# Patient Record
Sex: Male | Born: 1980 | Race: Black or African American | Hispanic: No | Marital: Single | Smoking: Current every day smoker
Health system: Southern US, Community
[De-identification: ages and names within clinical notes are randomized; demographics above are authoritative.]

## PROBLEM LIST (undated history)

## (undated) HISTORY — PX: FOOT SURGERY: SHX648

---

## 2006-11-13 ENCOUNTER — Emergency Department: Payer: Self-pay | Admitting: Emergency Medicine

## 2007-06-18 ENCOUNTER — Emergency Department: Payer: Self-pay | Admitting: Emergency Medicine

## 2008-04-07 ENCOUNTER — Emergency Department: Payer: Self-pay | Admitting: Emergency Medicine

## 2008-12-31 ENCOUNTER — Emergency Department: Payer: Self-pay | Admitting: Unknown Physician Specialty

## 2014-12-15 ENCOUNTER — Emergency Department: Payer: Self-pay | Admitting: Emergency Medicine

## 2015-07-03 ENCOUNTER — Encounter: Payer: Self-pay | Admitting: Emergency Medicine

## 2015-07-03 ENCOUNTER — Emergency Department
Admission: EM | Admit: 2015-07-03 | Discharge: 2015-07-03 | Disposition: A | Discharge status: Home / Self Care | Payer: Self-pay | Attending: Emergency Medicine | Admitting: Emergency Medicine

## 2015-07-03 DIAGNOSIS — N342 Other urethritis: Secondary | ICD-10-CM | POA: Insufficient documentation

## 2015-07-03 DIAGNOSIS — N390 Urinary tract infection, site not specified: Secondary | ICD-10-CM

## 2015-07-03 DIAGNOSIS — Z720 Tobacco use: Secondary | ICD-10-CM | POA: Insufficient documentation

## 2015-07-03 LAB — URINALYSIS COMPLETE WITH MICROSCOPIC (ARMC ONLY)
Bilirubin Urine: NEGATIVE
GLUCOSE, UA: NEGATIVE mg/dL
HGB URINE DIPSTICK: NEGATIVE
KETONES UR: NEGATIVE mg/dL
Nitrite: NEGATIVE
PH: 5 (ref 5.0–8.0)
Protein, ur: NEGATIVE mg/dL
SPECIFIC GRAVITY, URINE: 1.027 (ref 1.005–1.030)
SQUAMOUS EPITHELIAL / LPF: NONE SEEN

## 2015-07-03 LAB — CHLAMYDIA/NGC RT PCR (ARMC ONLY)
CHLAMYDIA TR: NOT DETECTED
N gonorrhoeae: DETECTED — AB

## 2015-07-03 MED ORDER — CEPHALEXIN 500 MG PO CAPS: 500.0000 mg | ORAL_CAPSULE | Freq: Two times a day (BID) | ORAL | Status: AC

## 2015-07-03 MED ORDER — AZITHROMYCIN 250 MG PO TABS
1000.0000 mg | ORAL_TABLET | Freq: Once | ORAL | Status: AC
  Administered 2015-07-03: 1000 mg via ORAL
  Filled 2015-07-03: qty 4

## 2015-07-03 MED ORDER — CEFTRIAXONE SODIUM 250 MG IJ SOLR
250.0000 mg | Freq: Once | INTRAMUSCULAR | Status: AC
  Administered 2015-07-03: 250 mg via INTRAMUSCULAR
  Filled 2015-07-03: qty 250

## 2015-07-03 NOTE — Discharge Instructions (Signed)
You were treated for presumptive sexually transmitted disease with azithromycin pills and Rocephin injection. You will be called if the test result was positive so you can call and notify any of your sexual partners for evaluation and treatment. Return to emergency department for any worsening condition including abdominal pain, fever, skin rash, or worsening discharge.   Urethritis Urethritis is an inflammation of the tube through which urine exits your bladder (urethra).  CAUSES Urethritis is often caused by an infection in your urethra. The infection can be viral, like herpes. The infection can also be bacterial, like gonorrhea. RISK FACTORS Risk factors of urethritis include:  Having sex without using a condom.  Having multiple sexual partners.  Having poor hygiene. SIGNS AND SYMPTOMS Symptoms of urethritis are less noticeable in women than in men. These symptoms include:  Burning feeling when you urinate (dysuria).  Discharge from your urethra.  Blood in your urine (hematuria).  Urinating more than usual. DIAGNOSIS  To confirm a diagnosis of urethritis, your health care provider will do the following:  Ask about your sexual history.  Perform a physical exam.  Have you provide a sample of your urine for lab testing.  Use a cotton swab to gently collect a sample from your urethra for lab testing. TREATMENT  It is important to treat urethritis. Depending on the cause, untreated urethritis may lead to serious genital infections and possibly infertility. Urethritis caused by a bacterial infection is treated with antibiotic medicine. All sexual partners must be treated.  HOME CARE INSTRUCTIONS  Do not have sex until the test results are known and treatment is completed, even if your symptoms go away before you finish treatment.  If you were prescribed an antibiotic, finish it all even if you start to feel better. SEEK MEDICAL CARE IF:   Your symptoms are not improved in 3  days.  Your symptoms are getting worse.  You develop abdominal pain or pelvic pain (in women).  You develop joint pain.  You have a fever. SEEK IMMEDIATE MEDICAL CARE IF:   You have severe pain in the belly, back, or side.  You have repeated vomiting. MAKE SURE YOU:  Understand these instructions.  Will watch your condition.  Will get help right away if you are not doing well or get worse. Document Released: 05/15/2001 Document Revised: 04/05/2014 Document Reviewed: 07/20/2013 [facility] Patient Information 2015 [place], [place]. This information is not intended to replace advice given to you by your health care provider. Make sure you discuss any questions you have with your health care provider.    Urinary Tract Infection Urinary tract infections (UTIs) can develop anywhere along your urinary tract. Your urinary tract is your body's drainage system for removing wastes and extra water. Your urinary tract includes two kidneys, two ureters, a bladder, and a urethra. Your kidneys are a pair of bean-shaped organs. Each kidney is about the size of your fist. They are located below your ribs, one on each side of your spine. CAUSES Infections are caused by microbes, which are microscopic organisms, including fungi, viruses, and bacteria. These organisms are so small that they can only be seen through a microscope. Bacteria are the microbes that most commonly cause UTIs. SYMPTOMS  Symptoms of UTIs may vary by age and gender of the patient and by the location of the infection. Symptoms in young women typically include a frequent and intense urge to urinate and a painful, burning feeling in the bladder or urethra during urination. Older women and men  are more likely to be tired, shaky, and weak and have muscle aches and abdominal pain. A fever may mean the infection is in your kidneys. Other symptoms of a kidney infection include pain in your back or sides below the ribs, nausea, and  vomiting. DIAGNOSIS To diagnose a UTI, your caregiver will ask you about your symptoms. Your caregiver also will ask to provide a urine sample. The urine sample will be tested for bacteria and white blood cells. White blood cells are made by your body to help fight infection. TREATMENT  Typically, UTIs can be treated with medication. Because most UTIs are caused by a bacterial infection, they usually can be treated with the use of antibiotics. The choice of antibiotic and length of treatment depend on your symptoms and the type of bacteria causing your infection. HOME CARE INSTRUCTIONS  If you were prescribed antibiotics, take them exactly as your caregiver instructs you. Finish the medication even if you feel better after you have only taken some of the medication.  Drink enough water and fluids to keep your urine clear or pale yellow.  Avoid caffeine, tea, and carbonated beverages. They tend to irritate your bladder.  Empty your bladder often. Avoid holding urine for long periods of time.  Empty your bladder before and after sexual intercourse.  After a bowel movement, women should cleanse from front to back. Use each tissue only once. SEEK MEDICAL CARE IF:   You have back pain.  You develop a fever.  Your symptoms do not begin to resolve within 3 days. SEEK IMMEDIATE MEDICAL CARE IF:   You have severe back pain or lower abdominal pain.  You develop chills.  You have nausea or vomiting.  You have continued burning or discomfort with urination. MAKE SURE YOU:   Understand these instructions.  Will watch your condition.  Will get help right away if you are not doing well or get worse. Document Released: 08/29/2005 Document Revised: 05/20/2012 Document Reviewed: 12/28/2011 [facility] Patient Information 2015 [place], [place]. This information is not intended to replace advice given to you by your health care provider. Make sure you discuss any questions you have with your health  care provider.

## 2015-07-03 NOTE — ED Notes (Signed)
Pt discharged home after verbalizing understanding of discharge instructions; nad noted. 

## 2015-07-03 NOTE — ED Provider Notes (Signed)
[facility] Emergency Department Provider Note   ____________________________________________  Time seen: 7:15 AM I have reviewed the triage vital signs and the triage nursing note.  HISTORY  Chief Complaint Penile Discharge   Historian Patient  HPI Erik Mcdonald is a 34 y.o. male who has had 1 day penile discharge and burning with urination. He's had no known contacts for an STD, however has been treated for an STD many years ago. No abdominal pain or back pain. No fever. No skin rashes. No joint problems.    History reviewed. No pertinent past medical history. prior STD  There are no active problems to display for this patient.   Past Surgical History  Procedure Laterality Date  . Foot surgery      Current Outpatient Rx  Name  Route  Sig  Dispense  Refill  . cephALEXin (KEFLEX) 500 MG capsule   Oral   Take 1 capsule (500 mg total) by mouth 2 (two) times daily.   14 capsule   0     Allergies Review of patient's allergies indicates no known allergies.  History reviewed. No pertinent family history.  Social History History  Substance Use Topics  . Smoking status: Current Every Day Smoker  . Smokeless tobacco: Not on file  . Alcohol Use: No    Review of Systems  Constitutional: Negative for fever. Eyes: Negative for visual changes. ENT: Negative for sore throat. Cardiovascular: Negative for chest pain. Respiratory: Negative for shortness of breath. Gastrointestinal: Negative for abdominal pain, vomiting and diarrhea. Genitourinary: Per history of present illness. Musculoskeletal: Negative for back pain. Skin: Negative for rash. Neurological: Negative for headaches, focal weakness or numbness. 10 point Review of Systems otherwise negative ____________________________________________   PHYSICAL EXAM:  VITAL SIGNS: ED Triage Vitals  Enc Vitals Group     BP 07/03/15 0518 160/105 mmHg     Pulse Rate 07/03/15 0517 76   Resp 07/03/15 0517 18     Temp 07/03/15 0517 98.4 F (36.9 C)     Temp Source 07/03/15 0517 Oral     SpO2 07/03/15 0517 96 %     Weight 07/03/15 0517 192 lb (87.091 kg)     Height 07/03/15 0517 6' (1.829 m)     Head Cir --      Peak Flow --      Pain Score 07/03/15 0517 8     Pain Loc --      Pain Edu? --      Excl. in GC? --      Constitutional: Alert and oriented. Well appearing and in no distress. Eyes: Conjunctivae are normal. Normal extraocular movements. ENT   Head: Normocephalic and atraumatic.   Nose: No congestion/rhinnorhea.   Mouth/Throat: Mucous membranes are moist.   Neck: No stridor. Cardiovascular/Chest:  Respiratory: Normal respiratory effort without tachypnea nor retractions. Gastrointestinal: Soft. No distention, no guarding, no rebound. Nontende Genitourinary/rectal: No skin swelling, or skin rash. No visible discharge at the meatus. Musculoskeletal: Nontender with normal range of motion in all extremities.  Neurologic:  Normal speech and language. No gross or focal neurologic deficits are appreciated. Skin:  Skin is warm, dry and intact. No rash noted. Psychiatric: Mood and affect are normal. Speech and behavior are normal. Patient exhibits appropriate insight and judgment.  ____________________________________________   EKG I, Governor Rooks, MD, the attending physician have personally viewed and interpreted all ECGs.  Note ECG performed ____________________________________________  LABS (pertinent positives/negatives)  Urinalysis negative nitrites, 2+ leukocytes, 0-5 red  blood cells, too numerous to count white blood cells, rare bacteria, squamous epithelials none  GC and chlamydia are pending  ____________________________________________  RADIOLOGY All Xrays were viewed by me. Imaging interpreted by Radiologist.  None __________________________________________  PROCEDURES  Procedure(s) performed: None Critical Care performed:  None  ____________________________________________   ED COURSE / ASSESSMENT AND PLAN  CONSULTATIONS: None  Pertinent labs & imaging results that were available during my care of the patient were reviewed by me and considered in my medical decision making (see chart for details).   Patient was treated for clinical urethritis with Rocephin 250 mg IM and is at the present 1 g by mouth. Urinalysis had bacteria seen with a white blood cells and leukocytes, so he was also treated for urinary tract infection with Keflex. Urinalysis was sent for culture. His current chlamydia tests are pending and will be several hours, so he was treated presumptively.  Patient / Family / Caregiver informed of clinical course, medical decision-making process, and agree with plan.   I discussed return precautions, follow-up instructions, and discharged instructions with patient and/or family.  ___________________________________________   FINAL CLINICAL IMPRESSION(S) / ED DIAGNOSES   Final diagnoses:  Urethritis  Urinary tract infection without hematuria, site unspecified    FOLLOW UP  Referred to: [facility] clinic, primary care, one week.   Governor Rooks, MD 07/03/15 [phone]

## 2015-07-03 NOTE — ED Notes (Signed)
Resumed care from April; pt resting comfortably. NAD noted.

## 2015-07-03 NOTE — ED Notes (Signed)
Pt presents to ER alert and in NAD. Pt states penile d/c for 1 day. Triaged by Dara Lords RN.

## 2015-07-08 LAB — URINE CULTURE: Culture: 10000

## 2015-09-02 ENCOUNTER — Emergency Department
Admission: EM | Admit: 2015-09-02 | Discharge: 2015-09-02 | Disposition: A | Discharge status: Home / Self Care | Payer: Self-pay | Attending: Emergency Medicine | Admitting: Emergency Medicine

## 2015-09-02 ENCOUNTER — Encounter: Payer: Self-pay | Admitting: *Deleted

## 2015-09-02 DIAGNOSIS — Z792 Long term (current) use of antibiotics: Secondary | ICD-10-CM | POA: Insufficient documentation

## 2015-09-02 DIAGNOSIS — Z720 Tobacco use: Secondary | ICD-10-CM | POA: Insufficient documentation

## 2015-09-02 DIAGNOSIS — Y9289 Other specified places as the place of occurrence of the external cause: Secondary | ICD-10-CM | POA: Insufficient documentation

## 2015-09-02 DIAGNOSIS — Y9389 Activity, other specified: Secondary | ICD-10-CM | POA: Insufficient documentation

## 2015-09-02 DIAGNOSIS — Y998 Other external cause status: Secondary | ICD-10-CM | POA: Insufficient documentation

## 2015-09-02 DIAGNOSIS — S30860A Insect bite (nonvenomous) of lower back and pelvis, initial encounter: Secondary | ICD-10-CM | POA: Insufficient documentation

## 2015-09-02 DIAGNOSIS — W57XXXA Bitten or stung by nonvenomous insect and other nonvenomous arthropods, initial encounter: Secondary | ICD-10-CM | POA: Insufficient documentation

## 2015-09-02 MED ORDER — TRIAMCINOLONE ACETONIDE 0.025 % EX OINT: 1.0000 "application " | TOPICAL_OINTMENT | Freq: Two times a day (BID) | CUTANEOUS | Status: AC

## 2015-09-02 MED ORDER — CYPROHEPTADINE HCL 4 MG PO TABS
4.0000 mg | ORAL_TABLET | Freq: Once | ORAL | Status: AC
  Administered 2015-09-02: 4 mg via ORAL
  Filled 2015-09-02: qty 1

## 2015-09-02 MED ORDER — FAMOTIDINE 20 MG PO TABS
40.0000 mg | ORAL_TABLET | Freq: Once | ORAL | Status: AC
  Administered 2015-09-02: 40 mg via ORAL
  Filled 2015-09-02: qty 2

## 2015-09-02 MED ORDER — PERMETHRIN 5 % EX CREA: TOPICAL_CREAM | CUTANEOUS | Status: AC

## 2015-09-02 MED ORDER — CYPROHEPTADINE HCL 4 MG PO TABS: 4.0000 mg | ORAL_TABLET | Freq: Three times a day (TID) | ORAL | Status: AC | PRN

## 2015-09-02 MED ORDER — RANITIDINE HCL 150 MG PO TABS: 150.0000 mg | ORAL_TABLET | Freq: Two times a day (BID) | ORAL | Status: AC

## 2015-09-02 NOTE — Discharge Instructions (Signed)
Bedbugs °Bedbugs are tiny bugs that live in and around beds. During the day, they hide in mattresses and other places near beds. They come out at night and bite people lying in bed. They need blood to live and grow. Bedbugs can be found in beds anywhere. Usually, they are found in places where many people come and go (hotels, shelters, hospitals). It does not matter whether the place is dirty or clean. °Getting bitten by bedbugs rarely causes a medical problem. The biggest problem can be getting rid of them.  This often takes the work of a pest control expert. °CAUSES °· Less use of pesticides. Bedbugs were common before the 1950s. Then, strong pesticides such as DDT nearly wiped them out. Today, these pesticides are not used because they harm the environment and can cause health problems. °· More travel. Besides mattresses, bedbugs can also live in clothing and luggage. They can come along as people travel from place to place. Bedbugs are more common in certain parts of the world. When people travel to those areas, the bugs can come home with them. °· Presence of birds and bats. Bedbugs often infest birds and bats. If you have these animals in or near your home, bedbugs may infest your house, too. °SYMPTOMS °It does not hurt to be bitten by a bedbug. You will probably not wake up when you are bitten. Bedbugs usually bite areas of the skin that are not covered. Symptoms may show when you wake up, or they may take a day or more to show up. Symptoms may include: °· Small red bumps on the skin. These might be lined up in a row or clustered in a group. °· A darker red dot in the middle of red bumps. °· Blisters on the skin. There may be swelling and very bad itching. These may be signs of an allergic reaction. This does not happen often. °DIAGNOSIS °Bedbug bites might look and feel like other types of insect bites. The bugs do not stay on the body like ticks or lice. They bite, drop off, and crawl away to hide. Your  caregiver will probably: °· Ask about your symptoms. °· Ask about your recent activities and travel. °· Check your skin for bedbug bites. °· Ask you to check at home for signs of bedbugs. You should look for: °¨ Spots or stains on the bed or nearby. This could be from bedbugs that were crushed or from their eggs or waste. °¨ Bedbugs themselves. They are reddish-brown, oval, and flat. They do not fly. They are about the size of an apple seed. °· Places to look for bedbugs include: °¨ Beds. Check mattresses, headboards, box springs, and bed frames. °¨ On drapes and curtains near the bed. °¨ Under carpeting in the bedroom. °¨ Behind electrical outlets. °¨ Behind any wallpaper that is peeling. °¨ Inside luggage. °TREATMENT °Most bedbug bites do not need treatment. They usually go away on their own in a few days. The bites are not dangerous. However, treatment may be needed if you have scratched so much that your skin has become infected. You may also need treatment if you are allergic to bedbug bites. Treatment options include: °· A drug that stops swelling and itching (corticosteroid). Usually, a cream is rubbed on the skin. If you have a bad rash, you may be given a corticosteroid pill. °· Oral antihistamines. These are pills to help control itching. °· Antibiotic medicines. An antibiotic may be prescribed for infected skin. °HOME CARE INSTRUCTIONS  °·   Take any medicine prescribed by your caregiver for your bites. Follow the directions carefully.  Consider wearing pajamas with long sleeves and pant legs.  Your bedroom may need to be treated. A pest control expert should make sure the bedbugs are gone. You may need to throw away mattresses or luggage. Ask the pest control expert what you can do to keep the bedbugs from coming back. Common suggestions include:  Putting a plastic cover over your mattress.  Washing and drying your clothes and bedding in hot water and a hot dryer. The temperature should be hotter  than 120 F (48.9 C). Bedbugs are killed by high temperatures.  Vacuuming carefully all around your bed. Vacuum in all cracks and crevices where the bugs might hide. Do this often.  Carefully checking all used furniture, bedding, or clothes that you bring into your house.  Eliminating bird nests and bat roosts.  If you get bedbug bites when traveling, check all your possessions carefully before bringing them into your house. If you find any bugs on clothes or in your luggage, consider throwing those items away. SEEK MEDICAL CARE IF:  You have red bug bites that keep coming back.  You have red bug bites that itch badly.  You have bug bites that cause a skin rash.  You have scratch marks that are red and sore. SEEK IMMEDIATE MEDICAL CARE IF: You have a fever. Document Released: 12/22/2010 Document Revised: 02/11/2012 Document Reviewed: 12/22/2010 [facility] Patient Information 2015 [place], [place]. This information is not intended to replace advice given to you by your health care provider. Make sure you discuss any questions you have with your health care provider.  Your exam is suspicious for infestation with bedbugs. Use the prescription lotion as directed. Take the meds as needed for itch.

## 2015-09-02 NOTE — ED Notes (Signed)
Pt reports bumps noted to inside of hands x 2 days. Reports it is all over body.

## 2015-09-02 NOTE — ED Notes (Signed)
Rash all over for 2 days

## 2015-09-02 NOTE — ED Notes (Signed)
meds ordered from pharmacy.

## 2015-09-02 NOTE — ED Notes (Signed)
Called pharmacy again requesting the medication.  They stated that they will send it over.

## 2015-09-02 NOTE — ED Provider Notes (Signed)
[facility] Emergency Department Provider Note ____________________________________________  Time seen: 1635  I have reviewed the triage vital signs and the nursing notes.  HISTORY  Chief Complaint  Rash  HPI Erik Mcdonald is a 34 y.o. male reports to the ED for evaluation of a rash to the hands, torso, and legs for the last 2 days. He does admit to sleeping at a friend's house, and the guest bedroom 3 days prior to arrival. Within 24 hours, he noted the rash. He does also admit to sleeping naked in the guest bedroom. He is also been reported that another guest had a similar rash outbreak while standing in the guest bedroom. He notes some itchiness to the rash to the palms, and irritation to the rash that is present in his gluteal cleft, which is aggravated by sweating. He denies any sick contacts, recent travel, or other exposures. He does admit to hypersensitivity reaction related to other insect bites.  History reviewed. No pertinent past medical history.  There are no active problems to display for this patient.   Past Surgical History  Procedure Laterality Date  . Foot surgery      Current Outpatient Rx  Name  Route  Sig  Dispense  Refill  . cephALEXin (KEFLEX) 500 MG capsule   Oral   Take 1 capsule (500 mg total) by mouth 2 (two) times daily.   14 capsule   0   . cyproheptadine (PERIACTIN) 4 MG tablet   Oral   Take 1 tablet (4 mg total) by mouth 3 (three) times daily as needed for allergies.   30 tablet   0   . permethrin (ELIMITE) 5 % cream      Apply from neck to feet x 1 Rinse after 24 hours.   60 g   0   . ranitidine (ZANTAC) 150 MG tablet   Oral   Take 1 tablet (150 mg total) by mouth 2 (two) times daily.   20 tablet   0   . triamcinolone (KENALOG) 0.025 % ointment   Topical   Apply 1 application topically 2 (two) times daily.   30 g   0    Allergies Review of patient's allergies indicates no known allergies.  No  family history on file.  Social History Social History  Substance Use Topics  . Smoking status: Current Every Day Smoker  . Smokeless tobacco: None  . Alcohol Use: Yes   Review of Systems  Constitutional: Negative for fever. Eyes: Negative for visual changes. ENT: Negative for sore throat. Cardiovascular: Negative for chest pain. Respiratory: Negative for shortness of breath. Gastrointestinal: Negative for abdominal pain, vomiting and diarrhea. Genitourinary: Negative for dysuria. Musculoskeletal: Negative for back pain. Skin: Positive for rash. Neurological: Negative for headaches, focal weakness or numbness. ____________________________________________  PHYSICAL EXAM:  VITAL SIGNS: ED Triage Vitals  Enc Vitals Group     BP 09/02/15 1614 147/89 mmHg     Pulse Rate 09/02/15 1614 75     Resp 09/02/15 1614 16     Temp 09/02/15 1614 98 F (36.7 C)     Temp Source 09/02/15 1614 Oral     SpO2 09/02/15 1614 95 %     Weight 09/02/15 1614 202 lb (91.627 kg)     Height 09/02/15 1614  (1.803 m)     Head Cir --      Peak Flow --      Pain Score --      Pain Loc --  Pain Edu? --      Excl. in GC? --    Constitutional: Alert and oriented. Well appearing and in no distress. Eyes: Conjunctivae are normal. PERRL. Normal extraocular movements. ENT   Head: Normocephalic and atraumatic.   Nose: No congestion/rhinorrhea.   Mouth/Throat: Mucous membranes are moist.   Neck: Supple. No thyromegaly. Hematological/Lymphatic/Immunological: No cervical lymphadenopathy. Cardiovascular: Normal rate, regular rhythm.  Respiratory: Normal respiratory effort. No wheezes/rales/rhonchi. Gastrointestinal: Soft and nontender. No distention. Musculoskeletal: Nontender with normal range of motion in all extremities.  Neurologic:  Normal gait without ataxia. Normal speech and language. No gross focal neurologic deficits are appreciated. Skin:  Skin is warm, dry and intact.  Patient with a papular rash noted to the hands, extremities, and torso. The rash is concentrated around the waist, and to the webspaces of the fingers. The noted to be pustular in nature. Psychiatric: Mood and affect are normal. Patient exhibits appropriate insight and judgment. ____________________________________________  PROCEDURES  Periactin 4 mg PO Famotodine 40 mg PO ____________________________________________  INITIAL IMPRESSION / ASSESSMENT AND PLAN / ED COURSE  Patient with the clinical diagnosis consistent with the bedbug exposure. He'll be discharged with prescriptions for Periactin, Zantac, Chantix alone cream, and Elimite cream to use. He is encouraged follow-up with his friend related to the potential bedbug exposure. Follow up with primary care provider as needed. ____________________________________________  FINAL CLINICAL IMPRESSION(S) / ED DIAGNOSES  Final diagnoses:  Bedbug bite      Lissa Hoard, PA-C 09/02/15 1851  Minna Antis, MD 09/02/15 2222

## 2016-05-14 ENCOUNTER — Encounter: Payer: Self-pay | Admitting: Emergency Medicine

## 2016-05-14 ENCOUNTER — Emergency Department
Admission: EM | Admit: 2016-05-14 | Discharge: 2016-05-14 | Disposition: A | Discharge status: Home / Self Care | Payer: Self-pay | Attending: Emergency Medicine | Admitting: Emergency Medicine

## 2016-05-14 DIAGNOSIS — R369 Urethral discharge, unspecified: Secondary | ICD-10-CM | POA: Insufficient documentation

## 2016-05-14 DIAGNOSIS — Z202 Contact with and (suspected) exposure to infections with a predominantly sexual mode of transmission: Secondary | ICD-10-CM | POA: Insufficient documentation

## 2016-05-14 DIAGNOSIS — Z792 Long term (current) use of antibiotics: Secondary | ICD-10-CM | POA: Insufficient documentation

## 2016-05-14 DIAGNOSIS — F17200 Nicotine dependence, unspecified, uncomplicated: Secondary | ICD-10-CM | POA: Insufficient documentation

## 2016-05-14 DIAGNOSIS — Z7982 Long term (current) use of aspirin: Secondary | ICD-10-CM | POA: Insufficient documentation

## 2016-05-14 LAB — URINALYSIS COMPLETE WITH MICROSCOPIC (ARMC ONLY)
BILIRUBIN URINE: NEGATIVE
GLUCOSE, UA: NEGATIVE mg/dL
Hgb urine dipstick: NEGATIVE
KETONES UR: NEGATIVE mg/dL
NITRITE: NEGATIVE
Protein, ur: 30 mg/dL — AB
SPECIFIC GRAVITY, URINE: 1.026 (ref 1.005–1.030)
pH: 5 (ref 5.0–8.0)

## 2016-05-14 LAB — CHLAMYDIA/NGC RT PCR (ARMC ONLY)
Chlamydia Tr: NOT DETECTED
N GONORRHOEAE: NOT DETECTED

## 2016-05-14 NOTE — ED Notes (Signed)
States he is having some dysuria and penile discharge

## 2016-05-14 NOTE — ED Provider Notes (Signed)
[facility] Emergency Department Provider Note   ____________________________________________  Time seen: Approximately 3:54 PM  I have reviewed the triage vital signs and the nursing notes.   HISTORY  Chief Complaint Exposure to STD    HPI Erik Mcdonald is a 35 y.o. male patient complain to 3 days urethral discharge.Patient last unprotected sexual intercourse was one week ago. He denies any penial lesions. Patient also complaining of dysuria. Patient denies any fever or chills. Patient denies any pain.   History reviewed. No pertinent past medical history.  There are no active problems to display for this patient.   Past Surgical History  Procedure Laterality Date  . Foot surgery      Current Outpatient Rx  Name  Route  Sig  Dispense  Refill  . cephALEXin (KEFLEX) 500 MG capsule   Oral   Take 1 capsule (500 mg total) by mouth 2 (two) times daily.   14 capsule   0   . cyproheptadine (PERIACTIN) 4 MG tablet   Oral   Take 1 tablet (4 mg total) by mouth 3 (three) times daily as needed for allergies.   30 tablet   0   . permethrin (ELIMITE) 5 % cream      Apply from neck to feet x 1 Rinse after 24 hours.   60 g   0   . ranitidine (ZANTAC) 150 MG tablet   Oral   Take 1 tablet (150 mg total) by mouth 2 (two) times daily.   20 tablet   0   . triamcinolone (KENALOG) 0.025 % ointment   Topical   Apply 1 application topically 2 (two) times daily.   30 g   0     Allergies Review of patient's allergies indicates no known allergies.  No family history on file.  Social History Social History  Substance Use Topics  . Smoking status: Current Every Day Smoker  . Smokeless tobacco: None  . Alcohol Use: Yes    Review of Systems Constitutional: No fever/chills Eyes: No visual changes. ENT: No sore throat. Cardiovascular: Denies chest pain. Respiratory: Denies shortness of breath. Gastrointestinal: No abdominal pain.  No  nausea, no vomiting.  No diarrhea.  No constipation. Genitourinary: Positive for dysuria and urethral discharge. Musculoskeletal: Negative for back pain. Skin: Negative for rash. Neurological: Negative for headaches, focal weakness or numbness.    ____________________________________________   PHYSICAL EXAM:  VITAL SIGNS: ED Triage Vitals  Enc Vitals Group     BP 05/14/16 1547 164/101 mmHg     Pulse Rate 05/14/16 1547 91     Resp 05/14/16 1547 18     Temp 05/14/16 1547 98.5 F (36.9 C)     Temp Source 05/14/16 1547 Oral     SpO2 05/14/16 1547 99 %     Weight 05/14/16 1547 205 lb (92.987 kg)     Height 05/14/16 1547 6' (1.829 m)     Head Cir --      Peak Flow --      Pain Score 05/14/16 1547 0     Pain Loc --      Pain Edu? --      Excl. in GC? --     Constitutional: Alert and oriented. Well appearing and in no acute distress. Eyes: Conjunctivae are normal. PERRL. EOMI. Head: Atraumatic. Nose: No congestion/rhinnorhea. Mouth/Throat: Mucous membranes are moist.  Oropharynx non-erythematous. Neck: No stridor.  No cervical spine tenderness to palpation. Hematological/Lymphatic/Immunilogical: No cervical lymphadenopathy. Cardiovascular: Normal  rate, regular rhythm. Grossly normal heart sounds.  Good peripheral circulation. Respiratory: Normal respiratory effort.  No retractions. Lungs CTAB. Gastrointestinal: Soft and nontender. No distention. No abdominal bruits. No CVA tenderness. Genitourinary: No penial lesions or discharge expressed from the urethra. No inguinal adenopathy. Musculoskeletal: No lower extremity tenderness nor edema.  No joint effusions. Neurologic:  Normal speech and language. No gross focal neurologic deficits are appreciated. No gait instability. Skin:  Skin is warm, dry and intact. No rash noted. Psychiatric: Mood and affect are normal. Speech and behavior are normal.  ____________________________________________   LABS (all labs ordered are  listed, but only abnormal results are displayed)  Labs Reviewed  URINALYSIS COMPLETEWITH MICROSCOPIC ([facility] ONLY) - Abnormal; Notable for the following:    Color, Urine YELLOW (*)    APPearance CLEAR (*)    Protein, ur 30 (*)    Leukocytes, UA TRACE (*)    Bacteria, UA RARE (*)    Squamous Epithelial / LPF 0-5 (*)    All other components within normal limits  CHLAMYDIA/NGC RT PCR ([facility] ONLY)   ____________________________________________  EKG   ____________________________________________  RADIOLOGY   ____________________________________________   PROCEDURES  Procedure(s) performed: None  Critical Care performed: No  ____________________________________________   INITIAL IMPRESSION / ASSESSMENT AND PLAN / ED COURSE  Pertinent labs & imaging results that were available during my care of the patient were reviewed by me and considered in my medical decision making (see chart for details).  Discussed the patient were negative urinalysis. Patient states after reviewing lab results that he is only here because his girlfriend told him he was exposed. Patient now denies urethral discharge. Just concern for STD. Advised patient [facility] for further evaluation and testing as needed. ____________________________________________   FINAL CLINICAL IMPRESSION(S) / ED DIAGNOSES  Final diagnoses:  Possible exposure to STD      NEW MEDICATIONS STARTED DURING THIS VISIT:  New Prescriptions   No medications on file     Note:  This document was prepared using Dragon voice recognition software and may include unintentional dictation errors.    Joni ReiningRonald K Smith, PA-C 05/14/16 1722  Sharman CheekPhillip Stafford, MD 05/15/16 [phone]

## 2016-06-19 IMAGING — CT CT HEAD WITHOUT CONTRAST
1 of 2 series · 15 of 30 positions shown, 19 images · non-contrast
Comparison: None.

CLINICAL DATA: Facial pain and swelling after multiple falls last
night. Initial encounter.

EXAM:
CT HEAD WITHOUT CONTRAST
TECHNIQUE: Contiguous axial images were obtained from the base of the skull
through the vertex without intravenous contrast.

[Series 2: head wo · axial · 0.42mm/px · z∈[+230,+364]mm · 15 of 36 slices shown, 19 images]
[im 3/36  brain]
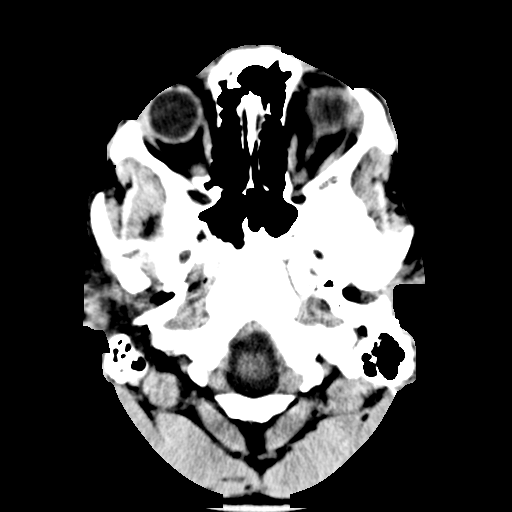
[im 3/36  bone]
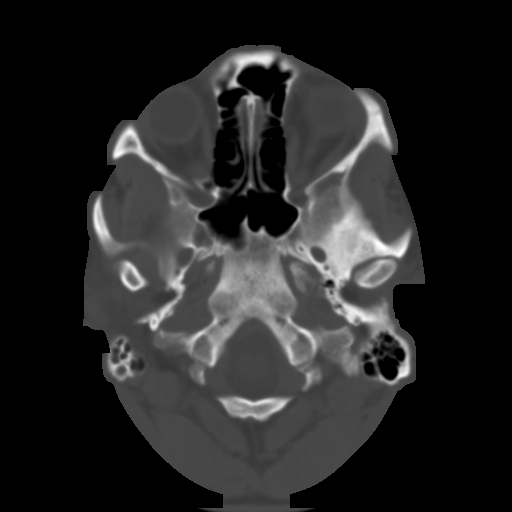
[im 5/36  brain]
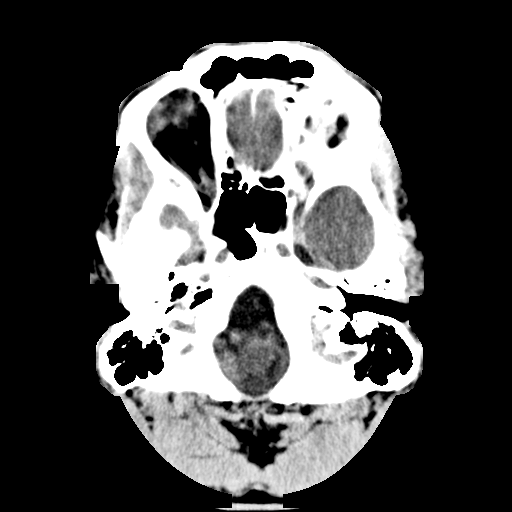
[im 7/36  brain]
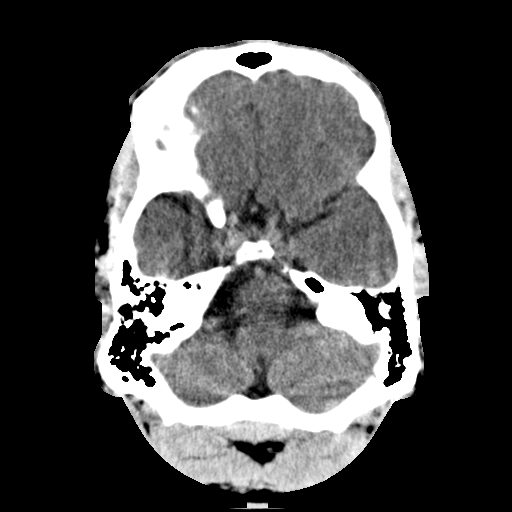
[im 9/36  brain]
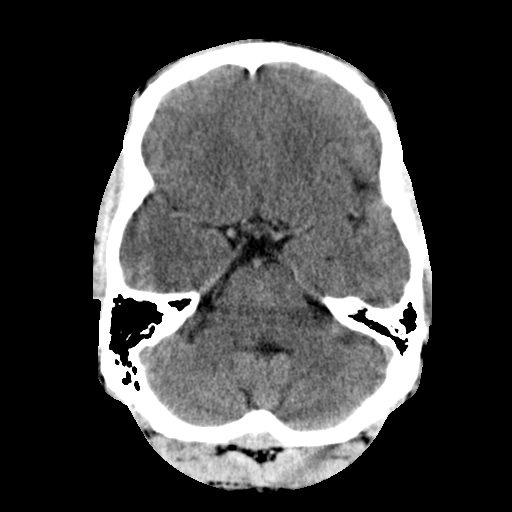
[im 11/36  brain]
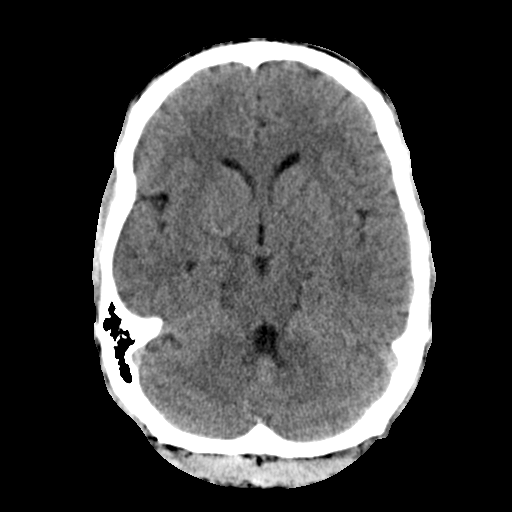
[im 11/36  bone]
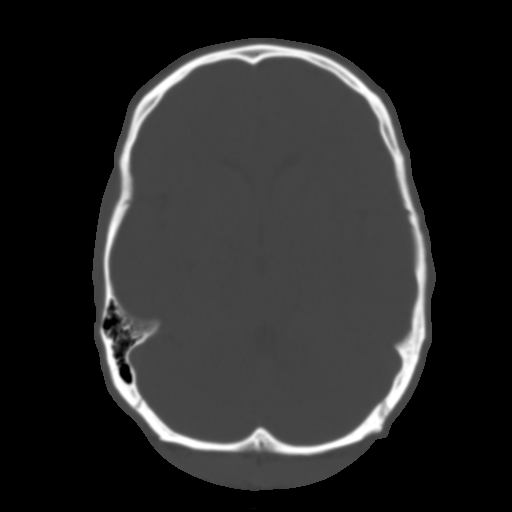
[im 14/36  brain]
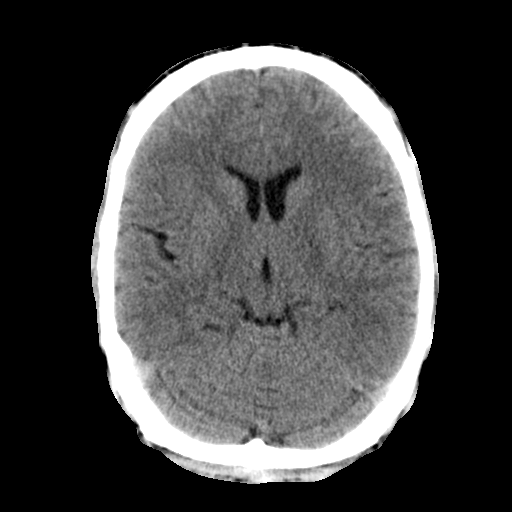
[im 16/36  brain]
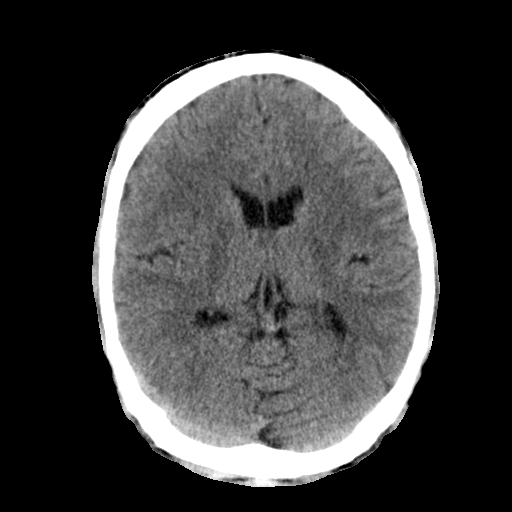
[im 18/36  brain]
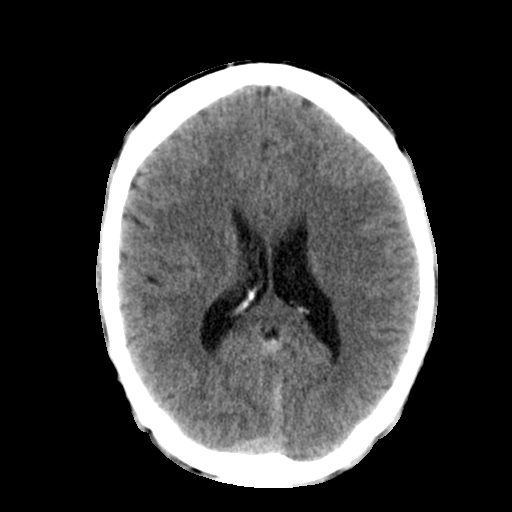
[im 20/36  brain]
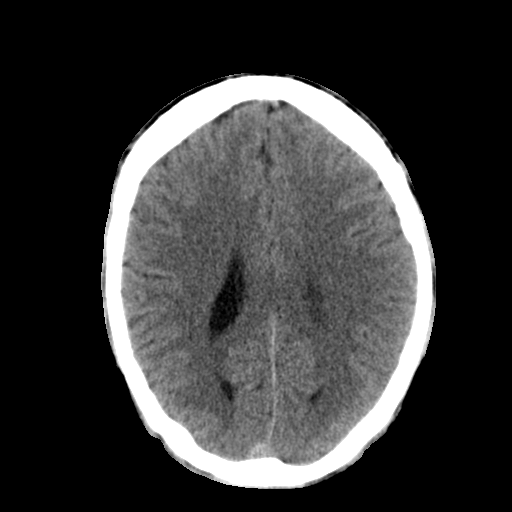
[im 20/36  bone]
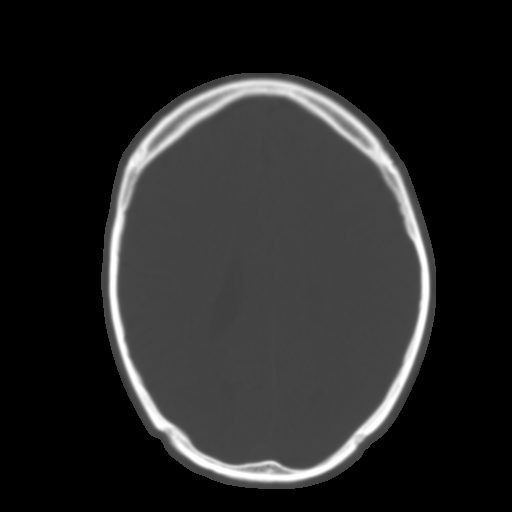
[im 22/36  brain]
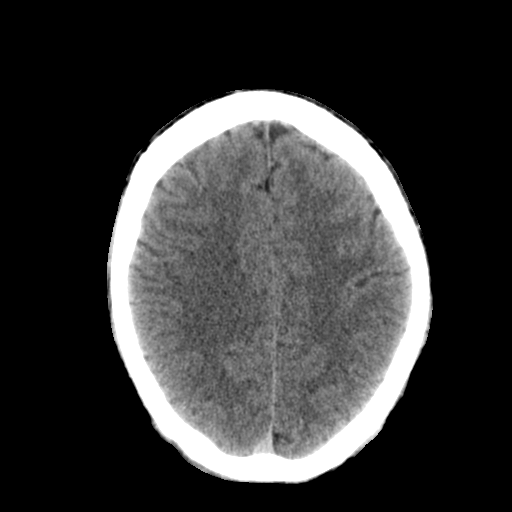
[im 25/36  brain]
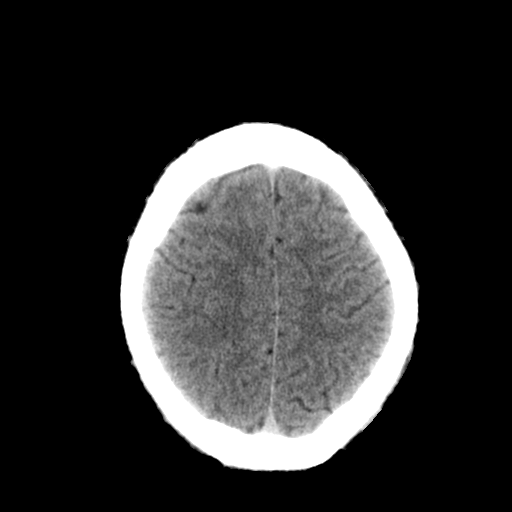
[im 27/36  brain]
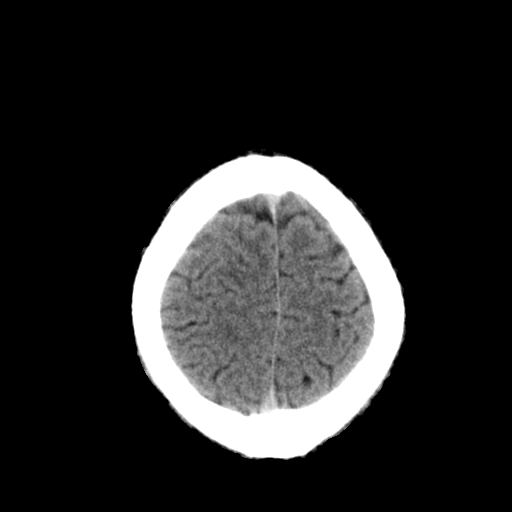
[im 29/36  brain]
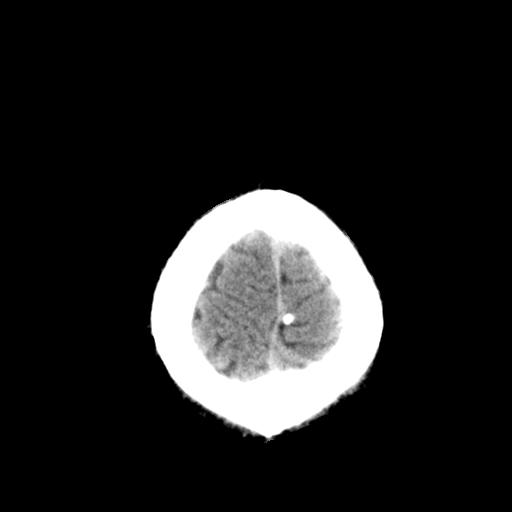
[im 29/36  bone]
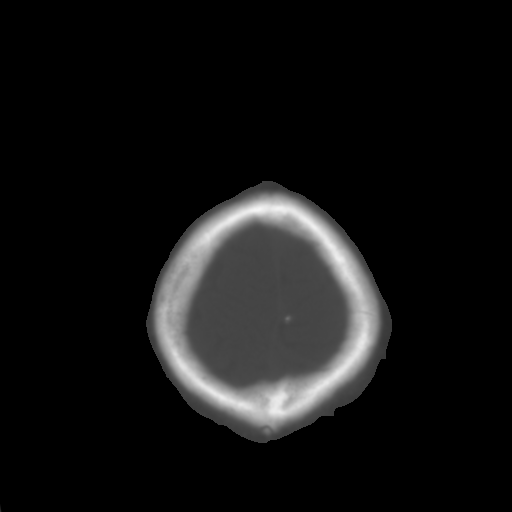
[im 31/36  brain]
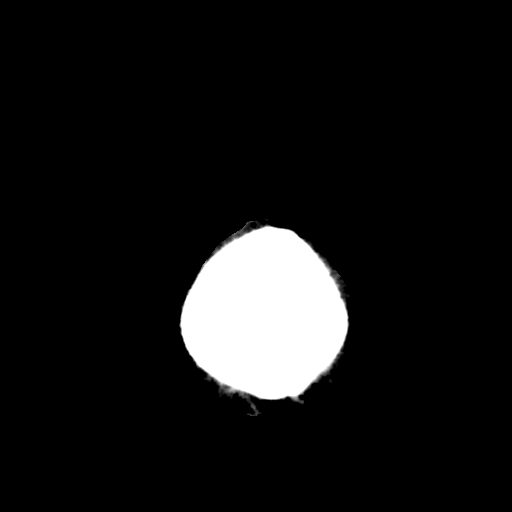
[im 33/36  brain]
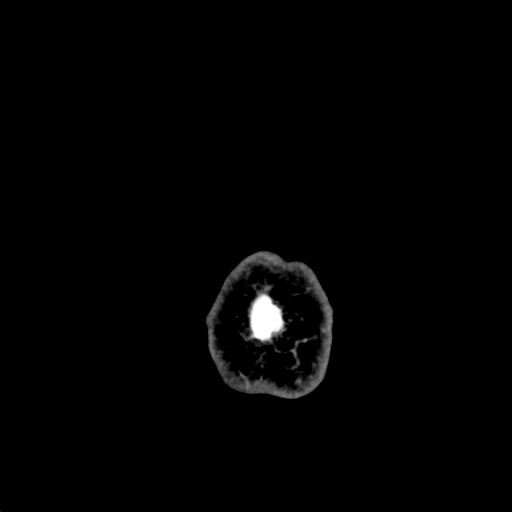

[15 of 30 positions shown; findings below may reference images not displayed]

FINDINGS: There is no evidence of acute intracranial hemorrhage, mass lesion,
brain edema or extra-axial fluid collection. The ventricles and
subarachnoid spaces are appropriately sized for age. There is no CT
evidence of acute cortical infarction.

The visualized paranasal sinuses, mastoid air cells and middle ears
are clear. The calvarium is intact. There is some soft tissue
swelling over the bridge of the nose, extending into the frontal
scalp.
IMPRESSION: No acute intracranial or calvarial findings. Facial soft tissue
swelling.

## 2023-11-03 ENCOUNTER — Emergency Department
Admission: EM | Admit: 2023-11-03 | Discharge: 2023-11-03 | Attending: Emergency Medicine | Admitting: Emergency Medicine

## 2023-11-03 ENCOUNTER — Other Ambulatory Visit: Payer: Self-pay

## 2023-11-03 DIAGNOSIS — R21 Rash and other nonspecific skin eruption: Secondary | ICD-10-CM | POA: Insufficient documentation

## 2023-11-03 DIAGNOSIS — T7840XA Allergy, unspecified, initial encounter: Secondary | ICD-10-CM | POA: Diagnosis present

## 2023-11-03 DIAGNOSIS — R238 Other skin changes: Secondary | ICD-10-CM

## 2023-11-03 MED ORDER — PREDNISONE 20 MG PO TABS
60.0000 mg | ORAL_TABLET | Freq: Once | ORAL | Status: AC
Start: 2023-11-03 — End: 2023-11-03
  Administered 2023-11-03: 60 mg via ORAL
  Filled 2023-11-03: qty 3

## 2023-11-03 MED ORDER — PREDNISONE 10 MG PO TABS: 60.0000 mg | ORAL_TABLET | Freq: Every day | ORAL | 0 refills | Status: AC

## 2023-11-03 NOTE — ED Provider Notes (Addendum)
[facility] Provider Note    Event Date/Time   First MD Initiated Contact with Patient 11/03/23 0827     (approximate)   History   Allergic Reaction   HPI Erik Mcdonald is a 42 y.o. male presenting today for rash.  Patient states on Thursday he started having bumps appear over his body.  He thought he may have had an allergic reaction to food served on Thanksgiving at the prison.  Notes bumps everywhere all over his body which are itchy and slightly painful.  Some of them look like pustules.  Denies fever, chills, shortness of breath, nausea, vomiting, abdominal pain, cough, congestion.  Does report history of chickenpox as a child.  Patient states that he did get all of his vaccinations as a child.     Physical Exam   Triage Vital Signs: ED Triage Vitals [11/03/23 0824]  Encounter Vitals Group     BP (!) 169/101     Systolic BP Percentile      Diastolic BP Percentile      Pulse Rate 92     Resp 19     Temp 98 F (36.7 C)     Temp src      SpO2 100 %     Weight      Height      Head Circumference      Peak Flow      Pain Score 10     Pain Loc      Pain Education      Exclude from Growth Chart     Most recent vital signs: Vitals:   11/03/23 0824  BP: (!) 169/101  Pulse: 92  Resp: 19  Temp: 98 F (36.7 C)  SpO2: 100%   I have reviewed the vital signs. General:  Awake, alert, no acute distress. Head:  Normocephalic, Atraumatic. EENT:  PERRL, EOMI, Oral mucosa pink and moist, Neck is supple. Cardiovascular: Regular rate, 2+ distal pulses. Respiratory:  Normal respiratory effort, symmetrical expansion, no distress.   Extremities:  Moving all four extremities through full ROM without pain.   Neuro:  Alert and oriented.  Interacting appropriately.   Skin: Vesicular rash scattered throughout the body in various stages of healing. Psych: Appropriate affect.    ED Results / Procedures / Treatments   Labs (all labs ordered are  listed, but only abnormal results are displayed) Labs Reviewed - No data to display   EKG    RADIOLOGY    PROCEDURES:  Critical Care performed: No  Procedures   MEDICATIONS ORDERED IN ED: Medications  predniSONE (DELTASONE) tablet 60 mg (has no administration in time range)     IMPRESSION / MDM / ASSESSMENT AND PLAN / ED COURSE  I reviewed the triage vital signs and the nursing notes.                              Differential diagnosis includes, but is not limited to, chickenpox, viral exanthem, low suspicion for allergic reaction  Patient's presentation is most consistent with acute, uncomplicated illness.  Patient is a 42 year old male presenting today for vesicular rash throughout his body.  Visual exam shows a rash of vesicles throughout the body in various stages of healing with some pus drainage from some of them.  The rash looks very consistent with a typical chickenpox rash.  He does have a history of chickenpox in the past when he was  young so although would be rare, it is not unlikely that he has gotten this again.  Otherwise, potential other viruses could cause this.  Will start patient on a short course of prednisone for symptomatic management and have him follow-up with the doctor in the prison in a couple of days for reassessment.  He has no other systemic symptoms with stable vital signs and otherwise unremarkable physical exam and I do not think further workup is indicated at this time.     FINAL CLINICAL IMPRESSION(S) / ED DIAGNOSES   Final diagnoses:  Vesicular rash     Rx / DC Orders   ED Discharge Orders          Ordered    predniSONE (DELTASONE) 10 MG tablet  Daily        11/03/23 0843             Note:  This document was prepared using Dragon voice recognition software and may include unintentional dictation errors.   Janith Lima, MD 11/03/23 4098    Janith Lima, MD 11/03/23 [phone]

## 2023-11-03 NOTE — ED Triage Notes (Addendum)
Pt comes with possible allergic reaction. Pt states he had Thanksgiving food at [employer] and then it started after that. Pt states only new stuff was dressing and gravy. Pt denies any new soaps.  Pt has several bumps all over body, neck, mouth, face. Pt states painful and did itch. Pt states he told staff but no one could see him til now. Pt states throat is sore too.

## 2023-11-03 NOTE — Discharge Instructions (Signed)
Please take the medication as prescribed over the next 7 days.  Please be reassessed by provider at the prison in the next couple of days.  Please return for any worsening symptoms like shortness of breath, difficulty breathing.

## 2024-11-03 ENCOUNTER — Encounter: Payer: Self-pay | Admitting: Sleep Medicine

## 2024-11-17 ENCOUNTER — Encounter: Payer: Self-pay | Admitting: Sleep Medicine
